# Patient Record
Sex: Male | Born: 1985 | Race: Black or African American | Hispanic: No | Marital: Single | Smoking: Current some day smoker
Health system: Southern US, Community
[De-identification: ages and names within clinical notes are randomized; demographics above are authoritative.]

## PROBLEM LIST (undated history)

## (undated) ENCOUNTER — Emergency Department (HOSPITAL_BASED_OUTPATIENT_CLINIC_OR_DEPARTMENT_OTHER): Admission: EM | Discharge status: Against Medical Advice | Payer: Self-pay

## (undated) DIAGNOSIS — J45909 Unspecified asthma, uncomplicated: Secondary | ICD-10-CM

## (undated) HISTORY — PX: HERNIA REPAIR: SHX51

## (undated) HISTORY — PX: FINGER SURGERY: SHX640

---
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
## 2011-03-04 ENCOUNTER — Emergency Department (INDEPENDENT_AMBULATORY_CARE_PROVIDER_SITE_OTHER): Payer: Self-pay

## 2011-03-04 ENCOUNTER — Emergency Department (HOSPITAL_BASED_OUTPATIENT_CLINIC_OR_DEPARTMENT_OTHER)
Admission: EM | Admit: 2011-03-04 | Discharge: 2011-03-04 | Disposition: A | Discharge status: Home / Self Care | Payer: Self-pay | Attending: Emergency Medicine | Admitting: Emergency Medicine

## 2011-03-04 ENCOUNTER — Encounter: Payer: Self-pay | Admitting: *Deleted

## 2011-03-04 DIAGNOSIS — N451 Epididymitis: Secondary | ICD-10-CM

## 2011-03-04 DIAGNOSIS — N509 Disorder of male genital organs, unspecified: Secondary | ICD-10-CM

## 2011-03-04 DIAGNOSIS — N5089 Other specified disorders of the male genital organs: Secondary | ICD-10-CM

## 2011-03-04 DIAGNOSIS — R109 Unspecified abdominal pain: Secondary | ICD-10-CM | POA: Insufficient documentation

## 2011-03-04 DIAGNOSIS — N453 Epididymo-orchitis: Secondary | ICD-10-CM | POA: Insufficient documentation

## 2011-03-04 DIAGNOSIS — N433 Hydrocele, unspecified: Secondary | ICD-10-CM

## 2011-03-04 LAB — URINALYSIS, ROUTINE W REFLEX MICROSCOPIC
Glucose, UA: NEGATIVE mg/dL
Ketones, ur: NEGATIVE mg/dL
Nitrite: NEGATIVE
Specific Gravity, Urine: 1.029 (ref 1.005–1.030)
pH: 5.5 (ref 5.0–8.0)

## 2011-03-04 LAB — URINE MICROSCOPIC-ADD ON

## 2011-03-04 MED ORDER — OXYCODONE-ACETAMINOPHEN 5-325 MG PO TABS
2.0000 | ORAL_TABLET | Freq: Once | ORAL | Status: AC
  Administered 2011-03-04: 2 via ORAL
  Filled 2011-03-04: qty 2

## 2011-03-04 MED ORDER — LIDOCAINE HCL (PF) 1 % IJ SOLN
INTRAMUSCULAR | Status: AC
  Administered 2011-03-04: 2.1 mL via INTRAMUSCULAR
  Filled 2011-03-04: qty 5

## 2011-03-04 MED ORDER — OXYCODONE-ACETAMINOPHEN 5-325 MG PO TABS: 2.0000 | ORAL_TABLET | ORAL | Status: AC | PRN

## 2011-03-04 MED ORDER — CEFTRIAXONE SODIUM 1 G IJ SOLR
1.0000 g | Freq: Once | INTRAMUSCULAR | Status: AC
  Administered 2011-03-04: 1 g via INTRAMUSCULAR
  Filled 2011-03-04: qty 1

## 2011-03-04 MED ORDER — AZITHROMYCIN 1 G PO PACK: 1.0000 g | PACK | Freq: Once | ORAL | Status: DC

## 2011-03-04 MED ORDER — LIDOCAINE HCL (PF) 1 % IJ SOLN
1.2000 mL | Freq: Once | INTRAMUSCULAR | Status: DC
  Filled 2011-03-04: qty 5

## 2011-03-04 MED ORDER — DOXYCYCLINE HYCLATE 100 MG PO CAPS: 100.0000 mg | ORAL_CAPSULE | Freq: Two times a day (BID) | ORAL | Status: AC

## 2011-03-04 MED ORDER — AZITHROMYCIN 250 MG PO TABS
ORAL_TABLET | ORAL | Status: AC
  Administered 2011-03-04: 1000 mg via ORAL
  Filled 2011-03-04: qty 3

## 2011-03-04 MED ORDER — AZITHROMYCIN 250 MG PO TABS
ORAL_TABLET | ORAL | Status: AC
  Filled 2011-03-04: qty 4

## 2011-03-04 NOTE — ED Notes (Signed)
C/o pain and swelling in left testicle for two days.

## 2011-03-04 NOTE — ED Notes (Signed)
rx x 2 given for doxycycline and percocet- d/c home with ride

## 2011-03-04 NOTE — ED Provider Notes (Signed)
History     CSN: [id number] Arrival date & time: 03/04/2011  5:31 PM  Chief Complaint  Patient presents with  . Groin Pain    (Consider location/radiation/quality/duration/timing/severity/associated sxs/prior treatment) Patient is a 25 y.o. male presenting with testicular pain. The history is provided by the patient. No language interpreter was used.  Testicle Pain This is a new problem. The current episode started in the past 7 days. The problem occurs constantly. The problem has been gradually worsening. Associated symptoms include fatigue and a fever. The symptoms are aggravated by nothing. He has tried nothing for the symptoms. The treatment provided moderate relief.  Pt complains of swelling and pain to left testicle.  Pt reports red and painful now.  Pt has had a fever,   History reviewed. No pertinent past medical history.  History reviewed. No pertinent past surgical history.  No family history on file.  History  Substance Use Topics  . Smoking status: Current Some Day Smoker  . Smokeless tobacco: Not on file  . Alcohol Use: Yes      Review of Systems  Constitutional: Positive for fever and fatigue.  Genitourinary: Positive for testicular pain.  All other systems reviewed and are negative.    Allergies  Review of patient's allergies indicates no known allergies.  Home Medications  No current outpatient prescriptions on file.  BP 136/69  Pulse 81  Temp 99.5 F (37.5 C)  Resp 16  SpO2 96%  Physical Exam  Nursing note and vitals reviewed. Constitutional: He appears well-developed and well-nourished.  HENT:  Head: Normocephalic.  Eyes: Pupils are equal, round, and reactive to light.  Neck: Normal range of motion.  Cardiovascular: Normal rate.   Pulmonary/Chest: Effort normal.  Abdominal: Soft.  Genitourinary: Penis normal.       tntc wbc's  Musculoskeletal: Normal range of motion.  Neurological: He is alert.  Skin: Skin is warm and dry.    Psychiatric: He has a normal mood and affect.    ED Course  Procedures (including critical care time)  Labs Reviewed  URINALYSIS, ROUTINE W REFLEX MICROSCOPIC - Abnormal; Notable for the following:    Appearance TURBID (*)    Hgb urine dipstick MODERATE (*)    Protein, ur 100 (*)    Leukocytes, UA LARGE (*)    All other components within normal limits  URINE MICROSCOPIC-ADD ON - Abnormal; Notable for the following:    Bacteria, UA FEW (*)    All other components within normal limits   US Scrotum  03/04/2011  *RADIOLOGY REPORT*  Clinical Data:  Left testicular pain and swelling for 2 days  SCROTAL ULTRASOUND DOPPLER ULTRASOUND OF THE TESTICLES  Technique: Complete ultrasound examination of the testicles, epididymis, and other scrotal structures was performed.  Color and spectral Doppler ultrasound were also utilized to evaluate blood flow to the testicles.  Comparison:  None  Findings:  Right testis:  18 x 27 x 43 mm, homogeneous in echotexture without focal lesion.  Normal color Doppler signal.  Arterial and venous waveforms returned.  Left testis:  21 x 32 x 38 mm, normal in echotexture without focal lesion.  Normal color Doppler signal.  Arterial and venous waveforms returned.  Right epididymis:  Normal in size and appearance.  Left epididymis:  Normal in size and appearance.  Hydocele:  Small, bilateral  Varicocele:  None seen  Pulsed Doppler interrogation of both testes demonstrates low resistance flow bilaterally.  IMPRESSION:  1.  Normal testes.  No mass. 2.  Small  bilateral hydroceles.  Original Report Authenticated By: Osa Craver, M.D.   [place] Art/ven Flow Abd Pelv Doppler  03/04/2011  *RADIOLOGY REPORT*  Clinical Data:  Left testicular pain and swelling for 2 days  SCROTAL ULTRASOUND DOPPLER ULTRASOUND OF THE TESTICLES  Technique: Complete ultrasound examination of the testicles, epididymis, and other scrotal structures was performed.  Color and spectral Doppler ultrasound  were also utilized to evaluate blood flow to the testicles.  Comparison:  None  Findings:  Right testis:  18 x 27 x 43 mm, homogeneous in echotexture without focal lesion.  Normal color Doppler signal.  Arterial and venous waveforms returned.  Left testis:  21 x 32 x 38 mm, normal in echotexture without focal lesion.  Normal color Doppler signal.  Arterial and venous waveforms returned.  Right epididymis:  Normal in size and appearance.  Left epididymis:  Normal in size and appearance.  Hydocele:  Small, bilateral  Varicocele:  None seen  Pulsed Doppler interrogation of both testes demonstrates low resistance flow bilaterally.  IMPRESSION:  1.  Normal testes.  No mass. 2.  Small bilateral hydroceles.  Original Report Authenticated By: Osa Craver, M.D.     No diagnosis found.    MDM  Ultrasound  Good blood flow.   Urine shows wbc's tntc.   Pt given Rocephin and zithromax po.  Pt given rx for doxycycline and percocet for pain.      Langston Masker, [place] 03/04/11 2008  Langston Masker, [place] 03/04/11 2009

## 2011-03-05 NOTE — ED Provider Notes (Signed)
Medical screening examination/treatment/procedure(s) were performed by non-physician practitioner and as supervising physician I was immediately available for consultation/collaboration.  Tennyson Kallen, MD 03/05/11 0316 

## 2012-06-27 ENCOUNTER — Emergency Department (HOSPITAL_BASED_OUTPATIENT_CLINIC_OR_DEPARTMENT_OTHER)
Admission: EM | Admit: 2012-06-27 | Discharge: 2012-06-27 | Disposition: A | Discharge status: Home / Self Care | Payer: Self-pay | Attending: Emergency Medicine | Admitting: Emergency Medicine

## 2012-06-27 ENCOUNTER — Encounter (HOSPITAL_BASED_OUTPATIENT_CLINIC_OR_DEPARTMENT_OTHER): Payer: Self-pay | Admitting: *Deleted

## 2012-06-27 ENCOUNTER — Emergency Department (HOSPITAL_BASED_OUTPATIENT_CLINIC_OR_DEPARTMENT_OTHER): Payer: Self-pay

## 2012-06-27 DIAGNOSIS — J45909 Unspecified asthma, uncomplicated: Secondary | ICD-10-CM | POA: Insufficient documentation

## 2012-06-27 DIAGNOSIS — R630 Anorexia: Secondary | ICD-10-CM | POA: Insufficient documentation

## 2012-06-27 DIAGNOSIS — Z9889 Other specified postprocedural states: Secondary | ICD-10-CM | POA: Insufficient documentation

## 2012-06-27 DIAGNOSIS — I880 Nonspecific mesenteric lymphadenitis: Secondary | ICD-10-CM | POA: Insufficient documentation

## 2012-06-27 DIAGNOSIS — F17200 Nicotine dependence, unspecified, uncomplicated: Secondary | ICD-10-CM | POA: Insufficient documentation

## 2012-06-27 DIAGNOSIS — R110 Nausea: Secondary | ICD-10-CM | POA: Insufficient documentation

## 2012-06-27 HISTORY — DX: Unspecified asthma, uncomplicated: J45.909

## 2012-06-27 LAB — URINALYSIS, ROUTINE W REFLEX MICROSCOPIC
Bilirubin Urine: NEGATIVE
Hgb urine dipstick: NEGATIVE
Ketones, ur: NEGATIVE mg/dL
Nitrite: NEGATIVE
Protein, ur: NEGATIVE mg/dL
Specific Gravity, Urine: 1.031 — ABNORMAL HIGH (ref 1.005–1.030)
Urobilinogen, UA: 0.2 mg/dL (ref 0.0–1.0)

## 2012-06-27 LAB — COMPREHENSIVE METABOLIC PANEL
ALT: 31 U/L (ref 0–53)
Albumin: 4.4 g/dL (ref 3.5–5.2)
Calcium: 9.5 mg/dL (ref 8.4–10.5)
GFR calc Af Amer: 73 mL/min — ABNORMAL LOW (ref >90)
Glucose, Bld: 95 mg/dL (ref 70–99)
Potassium: 4.1 mEq/L (ref 3.5–5.1)
Sodium: 142 mEq/L (ref 135–145)
Total Protein: 8 g/dL (ref 6.0–8.3)

## 2012-06-27 LAB — CBC WITH DIFFERENTIAL/PLATELET
Basophils Relative: 0 % (ref 0–1)
Eosinophils Absolute: 0.2 10*3/uL (ref 0.0–0.7)
Eosinophils Relative: 3 % (ref 0–5)
Lymphs Abs: 2.5 10*3/uL (ref 0.7–4.0)
MCH: 28.8 pg (ref 26.0–34.0)
MCHC: 34 g/dL (ref 30.0–36.0)
MCV: 84.8 fL (ref 78.0–100.0)
Neutrophils Relative %: 57 % (ref 43–77)
Platelets: 196 10*3/uL (ref 150–400)
RDW: 13.6 % (ref 11.5–15.5)

## 2012-06-27 LAB — LIPASE, BLOOD: Lipase: 21 U/L (ref 11–59)

## 2012-06-27 MED ORDER — SODIUM CHLORIDE 0.9 % IV SOLN
Freq: Once | INTRAVENOUS | Status: AC
  Administered 2012-06-27: 21:00:00 via INTRAVENOUS

## 2012-06-27 MED ORDER — HYDROCODONE-ACETAMINOPHEN 5-325 MG PO TABS: 2.0000 | ORAL_TABLET | ORAL | Status: DC | PRN

## 2012-06-27 MED ORDER — ONDANSETRON 4 MG PO TBDP: 4.0000 mg | ORAL_TABLET | Freq: Three times a day (TID) | ORAL | Status: DC | PRN

## 2012-06-27 MED ORDER — IOHEXOL 300 MG/ML  SOLN
50.0000 mL | Freq: Once | INTRAMUSCULAR | Status: AC | PRN
  Administered 2012-06-27: 50 mL via ORAL

## 2012-06-27 MED ORDER — SODIUM CHLORIDE 0.9 % IV SOLN
Freq: Once | INTRAVENOUS | Status: AC
  Administered 2012-06-27: 18:00:00 via INTRAVENOUS

## 2012-06-27 MED ORDER — IOHEXOL 300 MG/ML  SOLN: 100.0000 mL | Freq: Once | INTRAMUSCULAR | Status: DC | PRN

## 2012-06-27 NOTE — ED Notes (Signed)
Patient back from CT. CT technician notified this RN that patient refused CT contrast d/t complaints of IV. Technician offered to start new IV access. Pt deferred, and was scanned w/o contrast.

## 2012-06-27 NOTE — ED Notes (Signed)
Able to obtain labs using butterfly to right hand x 1 attempt. Pt tolerated well.

## 2012-06-27 NOTE — ED Provider Notes (Signed)
History     CSN: [id number]  Arrival date & time 06/27/12  1620   First MD Initiated Contact with Patient 06/27/12 1713      Chief Complaint  Patient presents with  . Abdominal Pain    (Consider location/radiation/quality/duration/timing/severity/associated sxs/prior treatment) Patient is a 27 y.o. male presenting with abdominal pain. The history is provided by the patient.  Abdominal Pain The primary symptoms of the illness include abdominal pain and nausea. The current episode started 2 days ago. The onset of the illness was gradual. The problem has been gradually worsening.  Associated with: nothing. The patient has not had a change in bowel habit. Risk factors for an acute abdominal problem include a history of abdominal surgery. Additional symptoms associated with the illness include anorexia. Significant associated medical issues do not include PUD.  Pt reports pain in mid abdomen  History reviewed. No pertinent past medical history.  Past Surgical History  Procedure Date  . 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  History  Substance Use Topics  . Smoking status: Current Some Day Smoker  . Smokeless tobacco: Not on file  . Alcohol Use: No      Review of Systems  Gastrointestinal: Positive for nausea, abdominal pain and anorexia.  All other systems reviewed and are negative.    Allergies  Review of patient's allergies indicates no known allergies.  Home Medications   Current Outpatient Rx  Name  Route  Sig  Dispense  Refill  . MULTIVITAMINS PO CAPS   Oral   Take 1 capsule by mouth daily.           BP 128/73  Pulse 60  Temp 98.8 F (37.1 C) (Oral)  Resp 18  Ht 6\' 3"  (1.905 m)  Wt 225 lb (102.059 kg)  BMI 28.12 kg/m2  SpO2 100%  Physical Exam  Nursing note and vitals reviewed. Constitutional: He appears well-developed and well-nourished.  HENT:  Head: Normocephalic.  Right Ear: External ear  normal.  Left Ear: External ear normal.  Nose: Nose normal.  Mouth/Throat: Oropharynx is clear and moist.  Eyes: Conjunctivae normal are normal. Pupils are equal, round, and reactive to light.  Neck: Normal range of motion. Neck supple.  Cardiovascular: Normal rate and regular rhythm.   Pulmonary/Chest: Effort normal and breath sounds normal.  Abdominal: Soft. There is tenderness. There is guarding.  Musculoskeletal: Normal range of motion.  Neurological: He is alert.  Skin: Skin is warm.    ED Course  Procedures (including critical care time)  Labs Reviewed  URINALYSIS, ROUTINE W REFLEX MICROSCOPIC - Abnormal; Notable for the following:    Specific Gravity, Urine 1.031 (*)     All other components within normal limits   No results found.   No diagnosis found.    MDM   Results for orders placed during the hospital encounter of 06/27/12  URINALYSIS, ROUTINE W REFLEX MICROSCOPIC      Component Value Range   Color, Urine YELLOW  YELLOW   APPearance CLEAR  CLEAR   Specific Gravity, Urine 1.031 (*) 1.005 - 1.030   pH 5.5  5.0 - 8.0   Glucose, UA NEGATIVE  NEGATIVE mg/dL   Hgb urine dipstick NEGATIVE  NEGATIVE   Bilirubin Urine NEGATIVE  NEGATIVE   Ketones, ur NEGATIVE  NEGATIVE mg/dL   Protein, ur NEGATIVE  NEGATIVE mg/dL   Urobilinogen, UA 0.2  0.0 - 1.0 mg/dL   Nitrite  NEGATIVE  NEGATIVE   Leukocytes, UA NEGATIVE  NEGATIVE  CBC WITH DIFFERENTIAL      Component Value Range   WBC 7.6  4.0 - 10.5 K/uL   RBC 5.34  4.22 - 5.81 MIL/uL   Hemoglobin 15.4  13.0 - 17.0 g/dL   HCT 16.1  09.6 - 04.5 %   MCV 84.8  78.0 - 100.0 fL   MCH 28.8  26.0 - 34.0 pg   MCHC 34.0  30.0 - 36.0 g/dL   RDW 40.9  81.1 - 91.4 %   Platelets 196  150 - 400 K/uL   Neutrophils Relative 57  43 - 77 %   Neutro Abs 4.3  1.7 - 7.7 K/uL   Lymphocytes Relative 32  12 - 46 %   Lymphs Abs 2.5  0.7 - 4.0 K/uL   Monocytes Relative 8  3 - 12 %   Monocytes Absolute 0.6  0.1 - 1.0 K/uL   Eosinophils  Relative 3  0 - 5 %   Eosinophils Absolute 0.2  0.0 - 0.7 K/uL   Basophils Relative 0  0 - 1 %   Basophils Absolute 0.0  0.0 - 0.1 K/uL  COMPREHENSIVE METABOLIC PANEL      Component Value Range   Sodium 142  135 - 145 mEq/L   Potassium 4.1  3.5 - 5.1 mEq/L   Chloride 104  96 - 112 mEq/L   CO2 26  19 - 32 mEq/L   Glucose, Bld 95  70 - 99 mg/dL   BUN 17  6 - 23 mg/dL   Creatinine, Ser 7.82 (*) 0.50 - 1.35 mg/dL   Calcium 9.5  8.4 - 95.6 mg/dL   Total Protein 8.0  6.0 - 8.3 g/dL   Albumin 4.4  3.5 - 5.2 g/dL   AST 28  0 - 37 U/L   ALT 31  0 - 53 U/L   Alkaline Phosphatase 89  39 - 117 U/L   Total Bilirubin 0.2 (*) 0.3 - 1.2 mg/dL   GFR calc non Af Amer 63 (*) >90 mL/min   GFR calc Af Amer 73 (*) >90 mL/min  LIPASE, BLOOD      Component Value Range   Lipase 21  11 - 59 U/L   Ct Abdomen Pelvis Wo Contrast  06/27/2012  *RADIOLOGY REPORT*  Clinical Data: Cramping lower abdominal pain.  CT ABDOMEN AND PELVIS WITHOUT CONTRAST  Technique:  Multidetector CT imaging of the abdomen and pelvis was performed following the standard protocol without intravenous contrast.  Comparison: No comparison CT.  Findings: The appendix does not appear inflamed.  No extraluminal bowel inflammatory process, free fluid or free air is noted.  There are however, increased number of lymph nodes in the right lower quadrant largest measuring up to 1.6 cm.  This may reflect mesenteric adenitis.  Evaluation of solid abdominal viscera is limited by lack of IV contrast.  Taking this limitation into account no worrisome focal hepatic, splenic, pancreatic, renal or adrenal lesion.  No abdominal aortic aneurysm.  No bony destructive lesion.  Lung bases are clear.  IMPRESSION: The appendix does not appear inflamed.  No extraluminal bowel inflammatory process, free fluid or free air is noted.  There are however, increased number of lymph nodes in the right lower quadrant largest measuring up to 1.6 cm.  This may reflect mesenteric  adenitis.   Original Report Authenticated By: Lacy Duverney, M.D.        Pt given Iv fluids x 2  liters,    Pt advised of ct findings.  Pt given rx for hydrocodone and zofran.   Pt advised to return if symptoms worsen or change   Lonia Skinner [place], [place] 06/27/12 2119

## 2012-06-27 NOTE — ED Provider Notes (Signed)
Medical screening examination/treatment/procedure(s) were performed by non-physician practitioner and as supervising physician I was immediately available for consultation/collaboration.   Celene Kras, MD 06/27/12 2130

## 2012-06-27 NOTE — ED Notes (Signed)
Patient states he developed crampy lower abdominal pain for the last 36 hours.  States the pain in intermittent and shooting from his umbilicus toward his lower abdomen.  After the crampy abd pain, he has upper abdominal pain that feels like indigestion.  Denies nausea, vomiting or diarrhea.

## 2012-06-27 NOTE — ED Notes (Signed)
Unable to obtain labs with iv attempts. Will give liter of fluids for hydration and attempt labs after liter infuses. Langston Masker, pa is aware.

## 2012-06-27 NOTE — ED Notes (Signed)
Patient finished oral contrast, tolerated well.

## 2013-04-07 ENCOUNTER — Encounter (HOSPITAL_BASED_OUTPATIENT_CLINIC_OR_DEPARTMENT_OTHER): Payer: Self-pay | Admitting: Emergency Medicine

## 2013-04-07 ENCOUNTER — Emergency Department (HOSPITAL_BASED_OUTPATIENT_CLINIC_OR_DEPARTMENT_OTHER)
Admission: EM | Admit: 2013-04-07 | Discharge: 2013-04-07 | Disposition: A | Discharge status: Home / Self Care | Payer: Self-pay | Attending: Emergency Medicine | Admitting: Emergency Medicine

## 2013-04-07 ENCOUNTER — Emergency Department (HOSPITAL_BASED_OUTPATIENT_CLINIC_OR_DEPARTMENT_OTHER): Payer: Self-pay

## 2013-04-07 DIAGNOSIS — R0789 Other chest pain: Secondary | ICD-10-CM

## 2013-04-07 DIAGNOSIS — F17200 Nicotine dependence, unspecified, uncomplicated: Secondary | ICD-10-CM | POA: Insufficient documentation

## 2013-04-07 DIAGNOSIS — R071 Chest pain on breathing: Secondary | ICD-10-CM | POA: Insufficient documentation

## 2013-04-07 DIAGNOSIS — J45909 Unspecified asthma, uncomplicated: Secondary | ICD-10-CM | POA: Insufficient documentation

## 2013-04-07 DIAGNOSIS — R05 Cough: Secondary | ICD-10-CM

## 2013-04-07 MED ORDER — ALBUTEROL SULFATE HFA 108 (90 BASE) MCG/ACT IN AERS: 1.0000 | INHALATION_SPRAY | Freq: Four times a day (QID) | RESPIRATORY_TRACT | Status: AC | PRN

## 2013-04-07 MED ORDER — HYDROCODONE-ACETAMINOPHEN 5-325 MG PO TABS: 1.0000 | ORAL_TABLET | ORAL | Status: DC | PRN

## 2013-04-07 MED ORDER — GI COCKTAIL ~~LOC~~
30.0000 mL | Freq: Once | ORAL | Status: AC
  Administered 2013-04-07: 30 mL via ORAL
  Filled 2013-04-07: qty 30

## 2013-04-07 NOTE — ED Provider Notes (Signed)
Medical screening examination/treatment/procedure(s) were performed by non-physician practitioner and as supervising physician I was immediately available for consultation/collaboration.     Date: 04/07/2013  Rate: 66  Rhythm: normal sinus rhythm  QRS Axis: right  Intervals: normal  ST/T Wave abnormalities: normal  Conduction Disutrbances:none  Narrative Interpretation:   Old EKG Reviewed: none available    Shanna Cisco, MD 04/07/13 2000

## 2013-04-07 NOTE — ED Provider Notes (Signed)
CSN: [id number]     Arrival date & time 04/07/13  1304 History   First MD Initiated Contact with Patient 04/07/13 1413     Chief Complaint  Patient presents with  . Chest Pain   (Consider location/radiation/quality/duration/timing/severity/associated sxs/prior Treatment) Patient is a 27 y.o. male presenting with chest pain. The history is provided by the patient. No language interpreter was used.  Chest Pain Pain location:  L chest Associated symptoms: cough   Associated symptoms: no abdominal pain, no fever, no nausea, no shortness of breath and no weakness   Associated symptoms comment:  Chest pain associated with cough for the past several days. Initially, pain was located in right chest but now in the left. No SOB or painful breathing. Pain is worst with cough or certain movements. No N, V. No fever.   Past Medical History  Diagnosis Date  . Asthma    Past Surgical History  Procedure Laterality Date  . Abdominal mass resection  2005    non cancerous twin mass   No family history on file. History  Substance Use Topics  . Smoking status: Current Some Day Smoker  . Smokeless tobacco: Not on file  . Alcohol Use: No    Review of Systems  Constitutional: Negative for fever and chills.  HENT: Negative.  Negative for congestion.   Respiratory: Positive for cough. Negative for shortness of breath.   Cardiovascular: Positive for chest pain.  Gastrointestinal: Negative.  Negative for nausea and abdominal pain.  Musculoskeletal: Negative.  Negative for myalgias.  Skin: Negative.   Neurological: Negative.  Negative for weakness.    Allergies  Review of patient's allergies indicates no known allergies.  Home Medications  No current outpatient prescriptions on file. BP 117/62  Pulse 52  Temp(Src) 99.5 F (37.5 C) (Oral)  Resp 18  SpO2 100% Physical Exam  Constitutional: He is oriented to person, place, and time. He appears well-developed and well-nourished.  HENT:   Head: Normocephalic.  Neck: Normal range of motion. Neck supple.  Cardiovascular: Normal rate and regular rhythm.   Pulmonary/Chest: Effort normal and breath sounds normal. He has no wheezes. He has no rales. He exhibits tenderness.  Abdominal: Soft. Bowel sounds are normal. There is no tenderness. There is no rebound and no guarding.  Musculoskeletal: Normal range of motion. He exhibits no edema.  Neurological: He is alert and oriented to person, place, and time.  Skin: Skin is warm and dry. No rash noted.  Psychiatric: He has a normal mood and affect.    ED Course  Procedures (including critical care time) Labs Review Labs Reviewed - No data to display Imaging Review Dg Chest 2 View  04/07/2013   CLINICAL DATA:  Left-sided upper chest pain this afternoon, also with upper back pain, history of asthma and smoking  EXAM: CHEST  2 VIEW  COMPARISON:  None.  FINDINGS: Borderline enlarged cardiac silhouette. Normal mediastinal contours. No focal airspace opacities. No pleural effusion or pneumothorax. No evidence of edema. No acute osseus abnormalities.  IMPRESSION: Borderline enlarged cardiac silhouette without acute cardiopulmonary disease.   Electronically Signed   By: Simonne Come M.D.   On: 04/07/2013 15:38    EKG Interpretation   None       MDM  No diagnosis found. 1. Chest wall pain 2. Cough  No better with GI cocktail. CXR without pna or other abnormality. Chest is reproducibly tender to palpation, c/w muscular chest wall pain. VSS, normal 100% O2 sat.  Arnoldo Hooker, PA-C 04/07/13 [phone]

## 2014-01-07 ENCOUNTER — Emergency Department (HOSPITAL_BASED_OUTPATIENT_CLINIC_OR_DEPARTMENT_OTHER)
Admission: EM | Admit: 2014-01-07 | Discharge: 2014-01-07 | Disposition: A | Discharge status: Home / Self Care | Payer: Self-pay | Attending: Emergency Medicine | Admitting: Emergency Medicine

## 2014-01-07 ENCOUNTER — Emergency Department (HOSPITAL_BASED_OUTPATIENT_CLINIC_OR_DEPARTMENT_OTHER): Payer: Self-pay

## 2014-01-07 ENCOUNTER — Encounter (HOSPITAL_BASED_OUTPATIENT_CLINIC_OR_DEPARTMENT_OTHER): Payer: Self-pay | Admitting: Emergency Medicine

## 2014-01-07 DIAGNOSIS — Z9889 Other specified postprocedural states: Secondary | ICD-10-CM | POA: Insufficient documentation

## 2014-01-07 DIAGNOSIS — J45909 Unspecified asthma, uncomplicated: Secondary | ICD-10-CM | POA: Insufficient documentation

## 2014-01-07 DIAGNOSIS — R109 Unspecified abdominal pain: Secondary | ICD-10-CM | POA: Insufficient documentation

## 2014-01-07 DIAGNOSIS — F17200 Nicotine dependence, unspecified, uncomplicated: Secondary | ICD-10-CM | POA: Insufficient documentation

## 2014-01-07 DIAGNOSIS — N509 Disorder of male genital organs, unspecified: Secondary | ICD-10-CM | POA: Insufficient documentation

## 2014-01-07 DIAGNOSIS — IMO0002 Reserved for concepts with insufficient information to code with codable children: Secondary | ICD-10-CM

## 2014-01-07 DIAGNOSIS — R6883 Chills (without fever): Secondary | ICD-10-CM | POA: Insufficient documentation

## 2014-01-07 DIAGNOSIS — Z79899 Other long term (current) drug therapy: Secondary | ICD-10-CM | POA: Insufficient documentation

## 2014-01-07 LAB — URINALYSIS, ROUTINE W REFLEX MICROSCOPIC
BILIRUBIN URINE: NEGATIVE
GLUCOSE, UA: NEGATIVE mg/dL
Hgb urine dipstick: NEGATIVE
KETONES UR: NEGATIVE mg/dL
LEUKOCYTES UA: NEGATIVE
Nitrite: NEGATIVE
PROTEIN: NEGATIVE mg/dL
Specific Gravity, Urine: 1.034 — ABNORMAL HIGH (ref 1.005–1.030)
Urobilinogen, UA: 1 mg/dL (ref 0.0–1.0)
pH: 6 (ref 5.0–8.0)

## 2014-01-07 NOTE — ED Notes (Signed)
Pain scale reentred for computer to allow me to discharge pt

## 2014-01-07 NOTE — ED Notes (Signed)
Pt c/o scrotum pain x 2 months , denies penile drainage

## 2014-01-07 NOTE — Discharge Instructions (Signed)
Take Tylenol or Advil for pain. Your ultrasound study and urine test today werenormal Call any of the numbers on the resource guide to get a primary care physician.  Emergency Department Resource Guide 1) Find a Doctor and Pay Out of Pocket Although you won't have to find out who is covered by your insurance plan, it is a good idea to ask around and get recommendations. You will then need to call the office and see if the doctor you have chosen will accept you as a new patient and what types of options they offer for patients who are self-pay. Some doctors offer discounts or will set up payment plans for their patients who do not have insurance, but you will need to ask so you aren't surprised when you get to your appointment.  2) Contact Your Local Health Department Not all health departments have doctors that can see patients for sick visits, but many do, so it is worth a call to see if yours does. If you don't know where your local health department is, you can check in your phone book. The CDC also has a tool to help you locate your state's health department, and many state websites also have listings of all of their local health departments.  3) Find a Walk-in Clinic If your illness is not likely to be very severe or complicated, you may want to try a walk in clinic. These are popping up all over the country in pharmacies, drugstores, and shopping centers. They're usually staffed by nurse practitioners or physician assistants that have been trained to treat common illnesses and complaints. They're usually fairly quick and inexpensive. However, if you have serious medical issues or chronic medical problems, these are probably not your best option.  No Primary Care Doctor: - Call Health Connect at  [phone] - they can help you locate a primary care doctor that  accepts your insurance, provides certain services, etc. - Physician Referral Service- [phone]  Chronic Pain  Problems: Organization         Address  Phone   Notes  [facility]  [phone] Patients need to be referred by their primary care doctor.   Medication Assistance: Organization         Address  Phone   Notes  [facility] [address] [place], [place] [address] [phone]) [phone] --Must be a resident of [facility] [place] -- Must have NO insurance coverage whatsoever (no Medicaid/ Medicare, etc.) -- The pt. MUST have a primary care doctor that directs their care regularly and follows them in the community   MedAssist  [phone]   [employer]  [phone]    Agencies that provide inexpensive medical care: Organization         Address  Phone   Notes  [facility]  [phone]   [facility]    [phone]   [facility] [address] [place], [place] [address] [phone]   [facility] [address], [place] [phone]) [phone]   [facility]    [phone]) [phone]   [facility]    [phone]) [phone]   [facility]  [address], [place] Phone:  [phone], Fax:  [phone] Hours of Operation:  9 am - 6 pm, M-F.  Also accepts Medicaid/Medicare and self-pay.  [facility]  [address]  [address], [place] Phone: [phone], Fax: [phone]. Hours of Operation:  8:30 am - 5:30 pm, M-F.  Also accepts Medicaid and self-pay.  [facility] [place] [address] [place] Phone: [phone]   [place], [place], [place] [phone], Ext. [phone] Mondays & Thursdays: 7-9 AM.  First 15 patients are seen on a first come, first serve basis.    [place] Providers:  Organization         Address  Phone   Notes  [facility] [address], [place] [phone] Also  accepts self-pay patients.  [facility] [address], [place]  [phone]   [place] [address], [place] [phone]   [facility] [address] [place] [phone]   Lucianne Lei [address], [place]   [phone] Only accepts [place] Access [place] patients after they have their name applied to their card.   Self-Pay (no insurance) in [facility]:  Organization         Address  Phone   Notes  Sickle Cell Patients, [facility] [place] [phone]   [facility] [place] [phone]   [facility] [place]  [place], [place], [place] [phone]   [facility]/Dr. Osei-Bonsu  [address] [place] [address], [place] or [address], [place] [phone] Phone number for both [place] and [place] locations is the same.  [facility] [address] [place] [phone]   [facility] [address], [place] or [address] [phone]   [facility] [address], [place] [phone], phone; [phone], fax Sees patients 1st and 3rd Saturday of every month.  Must not qualify for public or private insurance (i.e. Medicaid, Medicare, [place] Health Choice, Veterans' Benefits)  Household income should be no more than 200% of the poverty level The clinic cannot treat you if you are pregnant or think you are pregnant  Sexually transmitted diseases are not treated at the clinic.    Dental Care: Organization         Address  Phone  Notes  [facility] [phone] Accepts children up to age 14 who are enrolled in [place] or [place]; pregnant  women with a Medicaid card; and children who have applied for Medicaid or [place] Health Choice, but were declined, whose parents can pay a reduced fee at time of service.  [facility]  [address], [place] [phone] Accepts children up to age 57 who are enrolled in [place] or [place]; pregnant women with a Medicaid card; and children who have applied for Medicaid or [place] Health Choice, but were declined, whose parents can pay a reduced fee at time of service.  [facility]  [place] [phone] Patients are seen by appointment only. Walk-ins are not accepted. [place] will see patients 65 years of age and older. Monday - Tuesday (8am-5pm) Most Wednesdays (8:30-5pm) $30 per visit, cash only  [facility] [facility]  [address], [place] [phone] Patients are seen by appointment only. Walk-ins are not accepted. [facility] will see patients 18 years of age and older. °One Wednesday Evening (Monthly: Volunteer Based).  $30 per visit, cash only  °[facility]  [phone] for adults; Children under age 4, call Graduate Pediatric Dentistry at [phone]. Children aged 4-14, please call [phone] to request a pediatric application. ° Dental services are provided in all areas of dental care including fillings, crowns and bridges, complete and partial dentures, implants, gum treatment, root canals, and extractions. Preventive care is also provided. Treatment is provided to both adults and children. °Patients are selected via a lottery and there is often a waiting list. °  °[facility] [address], °[place] ° ([phone] [email or web address] °  °[facility] [address], [place], [place] [phone], Ext. [phone] Second and Fourth Thursday of each month, opens at 6:30 AM; Clinic ends at 9 AM.  Patients are  seen on a first-come first-served basis, and a limited number are seen during each clinic.  ° °[facility] ° [address], [place], [place] [phone]   Eligibility Requirements °You must have lived in [place], [place], or [place] counties for at least the last three months. °  You cannot be eligible for state or federal sponsored healthcare insurance, including Veterans Administration, Medicaid, or Medicare. °  You generally cannot be eligible for healthcare insurance through your employer.  °  How to apply: °Eligibility screenings are held every Tuesday and Wednesday afternoon from 1:00 pm until 4:00 pm. You do not need an appointment for the interview!  °[facility] [address], [place], [place] [phone]   °[facility]  [phone]   °[facility]  [phone]   °[facility]  336-570-6415   ° °Behavioral Health Resources in the Community: °Intensive Outpatient Programs °Organization         Address  Phone  Notes  °[facility] [address], [place], [place] [phone]   °[facility] [address], [place], [place] [phone]   °[facility] [address], [place], [place] ° 336-882-2125   °[facility] [address],  °[place], [place] 1-800-853-5163 or 336-641-4981   °Substance Abuse Resources °Organization         Address  Phone  Notes  °[facility]  336-882-2125   °[facility]  336-784-9470   °[facility]  336-285-9073   °[facility]  336-845-3988   °Residential & Outpatient Substance Abuse Program  1-800-659-3381   °Psychological Services °Organization         Address  Phone  Notes  ° Health  336- [phone]   °[facility]  336- 378-7881   °[facility] [address], [place] 1-800-853-5163 or 336-641-4981   ° °Mobile Crisis  Teams °Organization         Address  Phone  Notes  °[facility], Mobile Crisis Care Unit  1-877-626-1772   °Assertive °[facility] Services ° [address] [place], [place] 336-834-9664   °Sharon DeEsch [address] °[place] 336-554-5454   ° °Self-Help/Support Groups °Organization         Address  Phone             Notes  °[facility] [place] -   variety of support groups  336- 373-1402 Call for more information  °Narcotics Anonymous (NA), [facility] [address], °[place]  2 meetings at this location  ° °Residential Treatment Programs °Organization         Address  Phone  Notes  °[facility] [address],    °[place]  1-866-801-8205   °[facility] ° [address], [place], [place] [phone]   °[facility] [address], [place] 336-845-3988 Admissions: 8am-3pm M-F  °[facility] [address]    °[place], [place] 336-841-1104   °[facility] [address], [place], [place] 336-379-7146   °[facility] [address]  °[place], [place] 336-285-9073   °[facility] - Intensive Outpatient [address], [place], [place] 336-852-3033   °[facility] ([facility] [address]  °[place], [place] 1-877-615-2722 or 336-784-9470   °[facility] ([facility]) [address] [place], [place] 336-227-7417 Accepts Medicaid  °[facility] [address]  °[place] 1-800-659-3381 Substance Abuse/Addiction Treatment  ° °[place] [facility] Health Resources °Organization         Address  Phone  Notes  °[facility]  (888) 581-9988   °Julie Brannon, PhD [address] [place], [place]   (336) 349-5553 or (336) 951-0000   °[facility]   [address] °[place], [place] (336) 349-4454   °[facility] [address], [place], [place] (336) 342-8316  Insurance/Medicaid/sponsorship through [facility]  °[facility] [address]                                    [place], [place] (336) [phone] Therapy/tele-psych/case  °[facility] [address]  ° [place], [place] (336) [phone]    °Dr. Arfeen  (336) 349-4544   °[facility] [place]  [employer] [place] [facility] 1) [address],  °2) [address], [place] °3)  [address], [place] (336) 349-3220 °(336) 342-7768 ° °(336) 342-8140   °[place] Child Abuse Hotline (336) 342-1394 or (336) 342-3537 (After Hours)    ° ° °

## 2014-01-07 NOTE — ED Provider Notes (Signed)
CSN: [id number]     Arrival date & time 01/07/14  1531 History  This chart was scribed for Doug Sou, MD by Charline Bills, ED Scribe. The patient was seen in room MH07/MH07. Patient's care was started at 3:38 PM.   Chief Complaint  Patient presents with  . Testicle Pain   Patient is a 28 y.o. male presenting with testicular pain. The history is provided by the patient. No language interpreter was used.  Testicle Pain This is a recurrent problem. The current episode started more than 2 days ago. The problem has been gradually worsening. Associated symptoms include abdominal pain. The symptoms are aggravated by exertion. Nothing relieves the symptoms.   HPI Comments: Dale Gallagher is a 28 y.o. male who presents to the Emergency Department complaining of L sided scrotum pain. Pt reports similar pain last year. Pt was diagnosed with epididymitis and completed medication but states that pain never fully resolved. Pt states that pain has worsened over the past week due to working more. Pain is worse when he squats. He reports associated suprapubic pain and chills, last episode approximately 3 hours ago. He denies dysuria or penile discharge. Last normal BM was today. Pt consumed 2 sandwiches and a bag of chips today. He has taken Tylenol for pain. Pt is a smoker and occasional drinker. No illicit drug use. presently discomfort is minimal   Past Medical History  Diagnosis Date  . Asthma    Past Surgical History  Procedure Laterality Date  . 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 No pertinent family history. History  Substance Use Topics  . Smoking status: Current Some Day Smoker -- 0.50 packs/day    Types: Cigarettes  . Smokeless tobacco: Not on file  . Alcohol Use: No    Review of Systems  Constitutional: Positive for chills. Negative for appetite change. Fever: unknown.  HENT: Negative.   Respiratory: Negative.   Cardiovascular: Negative.    Gastrointestinal: Positive for abdominal pain.  Genitourinary: Positive for testicular pain. Negative for dysuria and discharge.  Musculoskeletal: Negative.   Skin: Negative.   Neurological: Negative.   Psychiatric/Behavioral: Negative.   All other systems reviewed and are negative.  Allergies  Review of patient's allergies indicates no known allergies.  Home Medications   Prior to Admission medications   Medication Sig Start Date End Date Taking? Authorizing Provider  albuterol (PROVENTIL HFA;VENTOLIN HFA) 108 (90 BASE) MCG/ACT inhaler Inhale 1-2 puffs into the lungs every 6 (six) hours as needed for wheezing or shortness of breath. 04/07/13   Arnoldo Hooker, PA-C   Triage Vitals: BP 142/73  Pulse 66  Temp(Src) 98.1 F (36.7 C) (Oral)  Resp 18  Ht 6\' 2"  (1.88 m)  Wt 220 lb (99.791 kg)  BMI 28.23 kg/m2  SpO2 100% Physical Exam  Nursing note and vitals reviewed. Constitutional: He appears well-developed and well-nourished.  HENT:  Head: Normocephalic and atraumatic.  Eyes: Conjunctivae are normal. Pupils are equal, round, and reactive to light.  Neck: Neck supple. No tracheal deviation present. No thyromegaly present.  Cardiovascular: Normal rate and regular rhythm.   No murmur heard. Pulmonary/Chest: Effort normal and breath sounds normal.  Abdominal: Soft. Bowel sounds are normal. He exhibits no distension. There is no tenderness.  Midline surgical scar  Genitourinary: Penis normal.  scrotum testes in normal lie, nontender. No scrotal masses, no hernia  Musculoskeletal: Normal range of motion. He exhibits no edema and no tenderness.  Neurological:  He is alert. Coordination normal.  Skin: Skin is warm and dry. No rash noted.  Psychiatric: He has a normal mood and affect.   ED Course  Procedures (including critical care time) DIAGNOSTIC STUDIES: Oxygen Saturation is 100% on RA, normal by my interpretation.    COORDINATION OF CARE: 3:44 PM-Discussed treatment plan  which includes UA with pt at bedside and pt agreed to plan.   Labs Review Labs Reviewed  URINALYSIS, ROUTINE W REFLEX MICROSCOPIC - Abnormal; Notable for the following:    Specific Gravity, Urine 1.034 (*)    All other components within normal limits   Imaging Review [place] Scrotum  01/07/2014   CLINICAL DATA:  Left scrotal pain  EXAM: SCROTAL ULTRASOUND  DOPPLER ULTRASOUND OF THE TESTICLES  TECHNIQUE: Complete ultrasound examination of the testicles, epididymis, and other scrotal structures was performed. Color and spectral Doppler ultrasound were also utilized to evaluate blood flow to the testicles.  COMPARISON:  Scrotal ultrasound 03/04/2011  FINDINGS: Right testicle  Measurements: 4.1 x 2.6 x 2.9 cm. No mass or microlithiasis visualized.  Left testicle  Measurements: 3.8 x 2.3 x 2.8 cm . No mass or microlithiasis visualized.  Right epididymis:  Normal in size and appearance.  Left epididymis:  Normal in size and appearance.  Hydrocele:  None visualized.  Varicocele:  None visualized.  Pulsed Doppler interrogation of both testes demonstrates normal low resistance arterial and venous waveforms bilaterally.  IMPRESSION: Normal study.   Electronically Signed   By: Charlett NoseKevin  Dover M.D.   On: 01/07/2014 17:19   [place] Art/ven Flow Abd Pelv Doppler  01/07/2014   CLINICAL DATA:  Left scrotal pain  EXAM: SCROTAL ULTRASOUND  DOPPLER ULTRASOUND OF THE TESTICLES  TECHNIQUE: Complete ultrasound examination of the testicles, epididymis, and other scrotal structures was performed. Color and spectral Doppler ultrasound were also utilized to evaluate blood flow to the testicles.  COMPARISON:  Scrotal ultrasound 03/04/2011  FINDINGS: Right testicle  Measurements: 4.1 x 2.6 x 2.9 cm. No mass or microlithiasis visualized.  Left testicle  Measurements: 3.8 x 2.3 x 2.8 cm . No mass or microlithiasis visualized.  Right epididymis:  Normal in size and appearance.  Left epididymis:  Normal in size and appearance.  Hydrocele:  None  visualized.  Varicocele:  None visualized.  Pulsed Doppler interrogation of both testes demonstrates normal low resistance arterial and venous waveforms bilaterally.  IMPRESSION: Normal study.   Electronically Signed   By: Charlett NoseKevin  Dover M.D.   On: 01/07/2014 17:19    EKG Interpretation None     Declines pain medication  6 PM resting comfortably Results for orders placed during the hospital encounter of 01/07/14  URINALYSIS, ROUTINE W REFLEX MICROSCOPIC      Result Value Ref Range   Color, Urine YELLOW  YELLOW   APPearance CLEAR  CLEAR   Specific Gravity, Urine 1.034 (*) 1.005 - 1.030   pH 6.0  5.0 - 8.0   Glucose, UA NEGATIVE  NEGATIVE mg/dL   Hgb urine dipstick NEGATIVE  NEGATIVE   Bilirubin Urine NEGATIVE  NEGATIVE   Ketones, ur NEGATIVE  NEGATIVE mg/dL   Protein, ur NEGATIVE  NEGATIVE mg/dL   Urobilinogen, UA 1.0  0.0 - 1.0 mg/dL   Nitrite NEGATIVE  NEGATIVE   Leukocytes, UA NEGATIVE  NEGATIVE   [place] Scrotum  01/07/2014   CLINICAL DATA:  Left scrotal pain  EXAM: SCROTAL ULTRASOUND  DOPPLER ULTRASOUND OF THE TESTICLES  TECHNIQUE: Complete ultrasound examination of the testicles, epididymis, and other scrotal structures  was performed. Color and spectral Doppler ultrasound were also utilized to evaluate blood flow to the testicles.  COMPARISON:  Scrotal ultrasound 03/04/2011  FINDINGS: Right testicle  Measurements: 4.1 x 2.6 x 2.9 cm. No mass or microlithiasis visualized.  Left testicle  Measurements: 3.8 x 2.3 x 2.8 cm . No mass or microlithiasis visualized.  Right epididymis:  Normal in size and appearance.  Left epididymis:  Normal in size and appearance.  Hydrocele:  None visualized.  Varicocele:  None visualized.  Pulsed Doppler interrogation of both testes demonstrates normal low resistance arterial and venous waveforms bilaterally.  IMPRESSION: Normal study.   Electronically Signed   By: Charlett Nose M.D.   On: 01/07/2014 17:19   [place] Art/ven Flow Abd Pelv Doppler  01/07/2014    CLINICAL DATA:  Left scrotal pain  EXAM: SCROTAL ULTRASOUND  DOPPLER ULTRASOUND OF THE TESTICLES  TECHNIQUE: Complete ultrasound examination of the testicles, epididymis, and other scrotal structures was performed. Color and spectral Doppler ultrasound were also utilized to evaluate blood flow to the testicles.  COMPARISON:  Scrotal ultrasound 03/04/2011  FINDINGS: Right testicle  Measurements: 4.1 x 2.6 x 2.9 cm. No mass or microlithiasis visualized.  Left testicle  Measurements: 3.8 x 2.3 x 2.8 cm . No mass or microlithiasis visualized.  Right epididymis:  Normal in size and appearance.  Left epididymis:  Normal in size and appearance.  Hydrocele:  None visualized.  Varicocele:  None visualized.  Pulsed Doppler interrogation of both testes demonstrates normal low resistance arterial and venous waveforms bilaterally.  IMPRESSION: Normal study.   Electronically Signed   By: Charlett Nose M.D.   On: 01/07/2014 17:19    MDM   Final diagnoses:  None   no signs of infection. Plan Tylenol or Advil for pain referral resource guide Diagnosis scrotal pain    I personally performed the services described in this documentation, which was scribed in my presence. The recorded information has been reviewed and is accurate.     Doug Sou, MD 01/07/14 [phone]

## 2014-12-29 ENCOUNTER — Emergency Department (HOSPITAL_BASED_OUTPATIENT_CLINIC_OR_DEPARTMENT_OTHER)
Admission: EM | Admit: 2014-12-29 | Discharge: 2014-12-29 | Disposition: A | Discharge status: Home / Self Care | Payer: Self-pay | Attending: Emergency Medicine | Admitting: Emergency Medicine

## 2014-12-29 ENCOUNTER — Emergency Department (HOSPITAL_BASED_OUTPATIENT_CLINIC_OR_DEPARTMENT_OTHER): Payer: Self-pay

## 2014-12-29 ENCOUNTER — Encounter (HOSPITAL_BASED_OUTPATIENT_CLINIC_OR_DEPARTMENT_OTHER): Payer: Self-pay | Admitting: *Deleted

## 2014-12-29 DIAGNOSIS — J45909 Unspecified asthma, uncomplicated: Secondary | ICD-10-CM | POA: Insufficient documentation

## 2014-12-29 DIAGNOSIS — R1084 Generalized abdominal pain: Secondary | ICD-10-CM | POA: Insufficient documentation

## 2014-12-29 DIAGNOSIS — R1013 Epigastric pain: Secondary | ICD-10-CM | POA: Insufficient documentation

## 2014-12-29 DIAGNOSIS — R1011 Right upper quadrant pain: Secondary | ICD-10-CM | POA: Insufficient documentation

## 2014-12-29 DIAGNOSIS — Z720 Tobacco use: Secondary | ICD-10-CM | POA: Insufficient documentation

## 2014-12-29 DIAGNOSIS — R1012 Left upper quadrant pain: Secondary | ICD-10-CM | POA: Insufficient documentation

## 2014-12-29 DIAGNOSIS — Z79899 Other long term (current) drug therapy: Secondary | ICD-10-CM | POA: Insufficient documentation

## 2014-12-29 LAB — COMPREHENSIVE METABOLIC PANEL
ALT: 36 U/L (ref 17–63)
ANION GAP: 8 (ref 5–15)
AST: 52 U/L — ABNORMAL HIGH (ref 15–41)
Albumin: 4 g/dL (ref 3.5–5.0)
Alkaline Phosphatase: 75 U/L (ref 38–126)
BILIRUBIN TOTAL: 0.4 mg/dL (ref 0.3–1.2)
BUN: 19 mg/dL (ref 6–20)
CHLORIDE: 105 mmol/L (ref 101–111)
CO2: 28 mmol/L (ref 22–32)
Calcium: 9.1 mg/dL (ref 8.9–10.3)
Creatinine, Ser: 1.53 mg/dL — ABNORMAL HIGH (ref 0.61–1.24)
GFR calc Af Amer: >60 mL/min (ref >60)
GLUCOSE: 101 mg/dL — AB (ref 65–99)
Potassium: 3.2 mmol/L — ABNORMAL LOW (ref 3.5–5.1)
Sodium: 141 mmol/L (ref 135–145)
Total Protein: 7 g/dL (ref 6.5–8.1)

## 2014-12-29 LAB — CBC WITH DIFFERENTIAL/PLATELET
BASOS ABS: 0 10*3/uL (ref 0.0–0.1)
BASOS PCT: 1 % (ref 0–1)
EOS ABS: 0.2 10*3/uL (ref 0.0–0.7)
EOS PCT: 3 % (ref 0–5)
HCT: 39.2 % (ref 39.0–52.0)
Hemoglobin: 13.1 g/dL (ref 13.0–17.0)
Lymphocytes Relative: 34 % (ref 12–46)
Lymphs Abs: 2.7 10*3/uL (ref 0.7–4.0)
MCH: 28.7 pg (ref 26.0–34.0)
MCHC: 33.4 g/dL (ref 30.0–36.0)
MCV: 86 fL (ref 78.0–100.0)
MONO ABS: 0.6 10*3/uL (ref 0.1–1.0)
Monocytes Relative: 7 % (ref 3–12)
NEUTROS ABS: 4.4 10*3/uL (ref 1.7–7.7)
Neutrophils Relative %: 55 % (ref 43–77)
Platelets: 217 10*3/uL (ref 150–400)
RBC: 4.56 MIL/uL (ref 4.22–5.81)
RDW: 13.2 % (ref 11.5–15.5)
WBC: 7.9 10*3/uL (ref 4.0–10.5)

## 2014-12-29 LAB — LIPASE, BLOOD: LIPASE: 20 U/L — AB (ref 22–51)

## 2014-12-29 NOTE — ED Notes (Signed)
Pt ambulatory to xray, steady gait.  

## 2014-12-29 NOTE — ED Notes (Signed)
C/o R lower back pain, onset 2d ago, also reports a little dizziness, fatigue and constipation, last BM 2d ago, (denies: injury, nvd, fever, bleeding, urinary sx or other sx). No meds PTA. "pain worse when lying on stomach and with certain movements".

## 2014-12-29 NOTE — ED Provider Notes (Signed)
CSN: [id number]     Arrival date & time 12/29/14  0401 History   First MD Initiated Contact with Patient 12/29/14 0422     Chief Complaint  Patient presents with  . Back Pain     (Consider location/radiation/quality/duration/timing/severity/associated sxs/prior Treatment) HPI Comments: Patient is a 29 year old male with no significant past medical history. He presents for evaluation of back and abdominal pain that has been worsening over the past week. He denies any injury or trauma. He reports his abdomen feeling distended and believes he may constipated. He reports feeling full with only a few bites of food. He denies any fevers or chills. He reports pain in his low back without radiation to his legs. He denies any bladder complaints.  Patient is a 29 y.o. male presenting with abdominal pain. The history is provided by the patient.  Abdominal Pain Pain location:  Periumbilical and epigastric Pain quality: cramping   Pain radiates to:  Does not radiate Pain severity:  Moderate Timing:  Constant Progression:  Worsening Chronicity:  New Relieved by:  Nothing Worsened by:  Movement, palpation and position changes Ineffective treatments:  None tried   Past Medical History  Diagnosis Date  . Asthma    Past Surgical History  Procedure Laterality Date  . 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 Hernia repair    . Finger surgery     History reviewed. No pertinent family history. History  Substance Use Topics  . Smoking status: Current Some Day Smoker -- 0.50 packs/day    Types: Cigarettes  . Smokeless tobacco: Not on file  . Alcohol Use: No    Review of Systems  Gastrointestinal: Positive for abdominal pain.  All other systems reviewed and are negative.     Allergies  Review of patient's allergies indicates no known allergies.  Home Medications   Prior to Admission medications   Medication Sig Start Date End Date Taking? Authorizing Provider   albuterol (PROVENTIL HFA;VENTOLIN HFA) 108 (90 BASE) MCG/ACT inhaler Inhale 1-2 puffs into the lungs every 6 (six) hours as needed for wheezing or shortness of breath. 04/07/13   Shari Upstill, PA-C   BP 133/65 mmHg  Pulse 64  Temp(Src) 99.1 F (37.3 C) (Oral)  Resp 18  Ht  (1.905 m)  Wt 231 lb (104.781 kg)  BMI 28.87 kg/m2  SpO2 98% Physical Exam  Constitutional: He is oriented to person, place, and time. He appears well-developed and well-nourished. No distress.  HENT:  Head: Normocephalic and atraumatic.  Neck: Normal range of motion. Neck supple.  Cardiovascular: Normal rate, regular rhythm and normal heart sounds.   No murmur heard. Pulmonary/Chest: Effort normal and breath sounds normal. No respiratory distress. He has no wheezes.  Abdominal: Soft. Bowel sounds are normal. He exhibits no distension. There is tenderness. There is no rebound and no guarding.  There is tenderness to palpation in the left upper quadrant, right upper quadrant, and epigastric region.  Musculoskeletal: Normal range of motion. He exhibits no edema.  Neurological: He is alert and oriented to person, place, and time.  Skin: Skin is warm and dry. He is not diaphoretic.  Nursing note and vitals reviewed.   ED Course  Procedures (including critical care time) Labs Review Labs Reviewed  COMPREHENSIVE METABOLIC PANEL  LIPASE, BLOOD  CBC WITH DIFFERENTIAL/PLATELET    Imaging Review No results found.   EKG Interpretation None      MDM   Final diagnoses:  Abdominal  pain, generalized    Workup reveals no elevation of white count, normal liver functions and lipase, and x-rays consistent with constipation. I highly doubt an emergent condition and feel as though he is appropriate for discharge. I will recommend magnesium citrate and have him return as needed for any problems.    Geoffery Lyons, MD 12/29/14 [phone]

## 2014-12-29 NOTE — Discharge Instructions (Signed)
Magnesium citrate: Drink entire 10 ounce bottle mixed with equal parts Sprite or Gatorade for relief of constipation.  Return to the emergency department if you develop high fever, bloody stools, worsening pain, or other new and concerning symptoms.   Abdominal Pain Many things can cause abdominal pain. Usually, abdominal pain is not caused by a disease and will improve without treatment. It can often be observed and treated at home. Your health care provider will do a physical exam and possibly order blood tests and X-rays to help determine the seriousness of your pain. However, in many cases, more time must pass before a clear cause of the pain can be found. Before that point, your health care provider may not know if you need more testing or further treatment. HOME CARE INSTRUCTIONS  Monitor your abdominal pain for any changes. The following actions may help to alleviate any discomfort you are experiencing:  Only take over-the-counter or prescription medicines as directed by your health care provider.  Do not take laxatives unless directed to do so by your health care provider.  Try a clear liquid diet (broth, tea, or water) as directed by your health care provider. Slowly move to a bland diet as tolerated. SEEK MEDICAL CARE IF:  You have unexplained abdominal pain.  You have abdominal pain associated with nausea or diarrhea.  You have pain when you urinate or have a bowel movement.  You experience abdominal pain that wakes you in the night.  You have abdominal pain that is worsened or improved by eating food.  You have abdominal pain that is worsened with eating fatty foods.  You have a fever. SEEK IMMEDIATE MEDICAL CARE IF:   Your pain does not go away within 2 hours.  You keep throwing up (vomiting).  Your pain is felt only in portions of the abdomen, such as the right side or the left lower portion of the abdomen.  You pass bloody or black tarry stools. MAKE SURE  YOU:  Understand these instructions.   Will watch your condition.   Will get help right away if you are not doing well or get worse.  Document Released: 02/17/2005 Document Revised: 05/15/2013 Document Reviewed: 01/17/2013 [facility] Patient Information 2015 [place], [place]. This information is not intended to replace advice given to you by your health care provider. Make sure you discuss any questions you have with your health care provider.

## 2015-01-24 ENCOUNTER — Emergency Department (HOSPITAL_BASED_OUTPATIENT_CLINIC_OR_DEPARTMENT_OTHER)
Admission: EM | Admit: 2015-01-24 | Discharge: 2015-01-25 | Disposition: A | Discharge status: Home / Self Care | Payer: Self-pay | Attending: Emergency Medicine | Admitting: Emergency Medicine

## 2015-01-24 ENCOUNTER — Encounter (HOSPITAL_BASED_OUTPATIENT_CLINIC_OR_DEPARTMENT_OTHER): Payer: Self-pay | Admitting: *Deleted

## 2015-01-24 DIAGNOSIS — X58XXXA Exposure to other specified factors, initial encounter: Secondary | ICD-10-CM | POA: Insufficient documentation

## 2015-01-24 DIAGNOSIS — Y9289 Other specified places as the place of occurrence of the external cause: Secondary | ICD-10-CM | POA: Insufficient documentation

## 2015-01-24 DIAGNOSIS — J45909 Unspecified asthma, uncomplicated: Secondary | ICD-10-CM | POA: Insufficient documentation

## 2015-01-24 DIAGNOSIS — Y998 Other external cause status: Secondary | ICD-10-CM | POA: Insufficient documentation

## 2015-01-24 DIAGNOSIS — Z720 Tobacco use: Secondary | ICD-10-CM | POA: Insufficient documentation

## 2015-01-24 DIAGNOSIS — M79672 Pain in left foot: Secondary | ICD-10-CM

## 2015-01-24 DIAGNOSIS — Y9389 Activity, other specified: Secondary | ICD-10-CM | POA: Insufficient documentation

## 2015-01-24 DIAGNOSIS — Z79899 Other long term (current) drug therapy: Secondary | ICD-10-CM | POA: Insufficient documentation

## 2015-01-24 DIAGNOSIS — S8391XA Sprain of unspecified site of right knee, initial encounter: Secondary | ICD-10-CM | POA: Insufficient documentation

## 2015-01-24 NOTE — ED Notes (Signed)
Pt reports right knee painx 2 weeks denies know injury

## 2015-01-24 NOTE — ED Provider Notes (Signed)
CSN: [id number]     Arrival date & time 01/24/15  2339 History  This chart was scribe for Pricilla Loveless, MD by Angelene Giovanni, ED Scribe. The patient was seen in room MH11/MH11 and the patient's care was started at 11:53 PM.    Chief Complaint  Patient presents with  . Knee Pain   The history is provided by the patient.   HPI Comments: Dale Gallagher is a 29 y.o. male who presents to the Emergency Department complaining of right knee pain onset 2 weeks ago and bottom of left heel pain onset 6 months ago. He reports associated right knee swelling but denies any swelling to the bottom of his left heel. He denies any injury to his right knee pain. He states that 6 months ago he was playing basketball and when he went to shoot the basket, he hit the bottom of his heel hard on the ground and he denies any other injury to the area. He states that he has tried Ibuprofen about 2 days ago with no relief. 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   Past Medical History  Diagnosis Date  . Asthma    Past Surgical History  Procedure Laterality Date  . 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 Hernia repair    . Finger surgery     History reviewed. No pertinent family history. Social History  Substance Use Topics  . Smoking status: Current Some Day Smoker -- 0.50 packs/day    Types: Cigarettes  . Smokeless tobacco: None  . Alcohol Use: No    Review of Systems  Constitutional: Negative for fever.  Musculoskeletal: Positive for joint swelling and arthralgias.  All other systems reviewed and are negative.     Allergies  Review of patient's allergies indicates no known allergies.  Home Medications   Prior to Admission medications   Medication Sig Start Date End Date Taking? Authorizing Provider  ibuprofen (ADVIL,MOTRIN) 400 MG tablet Take 400 mg by mouth every 6 (six) hours as needed.   Yes Historical Provider,  MD  albuterol (PROVENTIL HFA;VENTOLIN HFA) 108 (90 BASE) MCG/ACT inhaler Inhale 1-2 puffs into the lungs every 6 (six) hours as needed for wheezing or shortness of breath. 04/07/13   Shari Upstill, PA-C   BP 141/73 mmHg  Pulse 64  Temp(Src) 98 F (36.7 C) (Oral)  Resp 18  Ht 6\' 3"  (1.905 m)  Wt 230 lb (104.327 kg)  BMI 28.75 kg/m2  SpO2 99% Physical Exam  Constitutional: He is oriented to person, place, and time. He appears well-developed and well-nourished.  HENT:  Head: Normocephalic and atraumatic.  Right Ear: External ear normal.  Left Ear: External ear normal.  Nose: Nose normal.  Eyes: Right eye exhibits no discharge. Left eye exhibits no discharge.  Neck: Neck supple.  Cardiovascular: Normal rate and intact distal pulses.   Pulses:      Dorsalis pedis pulses are 2+ on the right side, and 2+ on the left side.  Pulmonary/Chest: Effort normal.  Abdominal: He exhibits no distension.  Musculoskeletal: He exhibits no edema.  Right knee: Mild infrapatellar swelling. Minimal tenderness, no joint effusion, no ligament laxity. No erythema or warmth Left heel: Point Tenderness over Left calcaneus, no swelling or erythema.    Neurological: He is alert and oriented to person, place, and time.  Normal strength and sensation in bilateral lower extremities  Skin: Skin is warm and dry. No erythema.  Nursing note and vitals reviewed.   ED Course  Procedures (including critical care time) DIAGNOSTIC STUDIES: Oxygen Saturation is 99% on RA, normal by my interpretation.    COORDINATION OF CARE: 11:57 PM- Pt advised of plan for treatment and pt agrees.    Labs Review Labs Reviewed - No data to display  Imaging Review Dg Knee Complete 4 Views Right  01/25/2015   CLINICAL DATA:  Right knee pain of 2 weeks duration.  No trauma.  EXAM: RIGHT KNEE - COMPLETE 4+ VIEW  COMPARISON:  None.  FINDINGS: Negative fracture, dislocation or radiopaque foreign body. No arthritic changes are evident.  There is no bone lesion or bony destruction.  IMPRESSION: Negative.   Electronically Signed   By: Ellery Plunk M.D.   On: 01/25/2015 00:55   Dg Foot Complete Left  01/25/2015   CLINICAL DATA:  Hit bottom of heel while playing basketball 6 months ago, persistent pain.  EXAM: LEFT FOOT - COMPLETE 3+ VIEW  COMPARISON:  None.  FINDINGS: There is no evidence of fracture or dislocation. Type 3 navicular bone. There is no evidence of arthropathy or other focal bone abnormality. Soft tissues are unremarkable.  IMPRESSION: Negative.   Electronically Signed   By: Awilda Metro M.D.   On: 01/25/2015 00:55   I have personally reviewed and evaluated these images and lab results as part of my medical decision-making.   EKG Interpretation None      MDM   Final diagnoses:  Right knee sprain, initial encounter  Heel pain, left    Patient is neurovascularly intact and has no evidence of acute bony injury or pathology. No significant knee swelling and no joint effusion that would signify acute infection. Full range of motion of knee appreciated. At this point will refer to sports medicine and recommend NSAIDs and ice as needed.  I personally performed the services described in this documentation, which was scribed in my presence. The recorded information has been reviewed and is accurate.   Pricilla Loveless, MD 01/25/15 [phone]

## 2015-01-24 NOTE — ED Notes (Signed)
Right knee pain x 2 weeks no know injury reports swelling nightly swelling relieved with ibuprofen, minimally  helpful with pain. Also with left heel pain after landing wrong while playing basketball x 6 months.

## 2015-01-25 ENCOUNTER — Emergency Department (HOSPITAL_BASED_OUTPATIENT_CLINIC_OR_DEPARTMENT_OTHER): Payer: Self-pay

## 2015-01-25 MED ORDER — IBUPROFEN 200 MG PO TABS
600.0000 mg | ORAL_TABLET | Freq: Once | ORAL | Status: AC
  Administered 2015-01-25: 600 mg via ORAL
  Filled 2015-01-25 (×2): qty 1

## 2015-08-12 ENCOUNTER — Emergency Department (HOSPITAL_BASED_OUTPATIENT_CLINIC_OR_DEPARTMENT_OTHER)
Admission: EM | Admit: 2015-08-12 | Discharge: 2015-08-13 | Disposition: A | Discharge status: Home / Self Care | Payer: Self-pay | Attending: Emergency Medicine | Admitting: Emergency Medicine

## 2015-08-12 ENCOUNTER — Encounter (HOSPITAL_BASED_OUTPATIENT_CLINIC_OR_DEPARTMENT_OTHER): Payer: Self-pay | Admitting: Emergency Medicine

## 2015-08-12 DIAGNOSIS — R51 Headache: Secondary | ICD-10-CM | POA: Insufficient documentation

## 2015-08-12 DIAGNOSIS — F17210 Nicotine dependence, cigarettes, uncomplicated: Secondary | ICD-10-CM | POA: Insufficient documentation

## 2015-08-12 DIAGNOSIS — R1110 Vomiting, unspecified: Secondary | ICD-10-CM | POA: Insufficient documentation

## 2015-08-12 DIAGNOSIS — R197 Diarrhea, unspecified: Secondary | ICD-10-CM | POA: Insufficient documentation

## 2015-08-12 DIAGNOSIS — J45909 Unspecified asthma, uncomplicated: Secondary | ICD-10-CM | POA: Insufficient documentation

## 2015-08-12 NOTE — ED Notes (Signed)
Pt reports constant headache x 3 days, pt vomited twice yesterday, +diarrhea

## 2015-09-15 ENCOUNTER — Encounter (HOSPITAL_BASED_OUTPATIENT_CLINIC_OR_DEPARTMENT_OTHER): Payer: Self-pay | Admitting: *Deleted

## 2015-09-15 DIAGNOSIS — F17210 Nicotine dependence, cigarettes, uncomplicated: Secondary | ICD-10-CM | POA: Insufficient documentation

## 2015-09-15 DIAGNOSIS — J029 Acute pharyngitis, unspecified: Secondary | ICD-10-CM | POA: Insufficient documentation

## 2015-09-15 DIAGNOSIS — J45909 Unspecified asthma, uncomplicated: Secondary | ICD-10-CM | POA: Insufficient documentation

## 2015-09-15 LAB — RAPID STREP SCREEN (MED CTR MEBANE ONLY): STREPTOCOCCUS, GROUP A SCREEN (DIRECT): NEGATIVE

## 2015-09-15 NOTE — ED Notes (Signed)
Sore throat since this am. States his son has strep throat.

## 2015-09-16 ENCOUNTER — Emergency Department (HOSPITAL_BASED_OUTPATIENT_CLINIC_OR_DEPARTMENT_OTHER): Payer: Medicaid Other

## 2015-09-16 ENCOUNTER — Emergency Department (HOSPITAL_BASED_OUTPATIENT_CLINIC_OR_DEPARTMENT_OTHER)
Admission: EM | Admit: 2015-09-16 | Discharge: 2015-09-16 | Disposition: A | Discharge status: Home / Self Care | Payer: Medicaid Other | Attending: Emergency Medicine | Admitting: Emergency Medicine

## 2015-09-16 DIAGNOSIS — J029 Acute pharyngitis, unspecified: Secondary | ICD-10-CM

## 2015-09-16 MED ORDER — CEPHALEXIN 500 MG PO CAPS: 500.0000 mg | ORAL_CAPSULE | Freq: Four times a day (QID) | ORAL | Status: AC

## 2015-09-16 NOTE — ED Notes (Signed)
C/o scratchy throat, chills cough and congestion x 1 day

## 2015-09-16 NOTE — ED Provider Notes (Signed)
CSN: [id number]     Arrival date & time 09/15/15  2253 History   First MD Initiated Contact with Patient 09/16/15 [phone]     Chief Complaint  Patient presents with  . Sore Throat     (Consider location/radiation/quality/duration/timing/severity/associated sxs/prior Treatment) HPI Comments: Patient is a 30 year old male who presents with complaints of sore throat. His son was diagnosed with strep and is being treated with antibiotics.  Patient is a 30 y.o. male presenting with pharyngitis. The history is provided by the patient.  Sore Throat This is a new problem. The current episode started 2 days ago. The problem occurs constantly. The problem has been rapidly worsening. Pertinent negatives include no chest pain and no shortness of breath. The symptoms are aggravated by swallowing, eating and drinking. Nothing relieves the symptoms. He has tried acetaminophen for the symptoms. The treatment provided no relief.    Past Medical History  Diagnosis Date  . Asthma    Past Surgical History  Procedure Laterality Date  . 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 Hernia repair    . Finger surgery     No family history on file. Social History  Substance Use Topics  . Smoking status: Current Some Day Smoker -- 0.50 packs/day    Types: Cigarettes  . Smokeless tobacco: None  . Alcohol Use: No    Review of Systems  Respiratory: Negative for shortness of breath.   Cardiovascular: Negative for chest pain.  All other systems reviewed and are negative.     Allergies  Review of patient's allergies indicates no known allergies.  Home Medications   Prior to Admission medications   Medication Sig Start Date End Date Taking? Authorizing Provider  albuterol (PROVENTIL HFA;VENTOLIN HFA) 108 (90 BASE) MCG/ACT inhaler Inhale 1-2 puffs into the lungs every 6 (six) hours as needed for wheezing or shortness of breath. 04/07/13   Elpidio Anis, PA-C  cephALEXin (KEFLEX) 500 MG  capsule Take 1 capsule (500 mg total) by mouth 4 (four) times daily. 09/16/15   Geoffery Lyons, MD  ibuprofen (ADVIL,MOTRIN) 400 MG tablet Take 400 mg by mouth every 6 (six) hours as needed.    Historical Provider, MD   BP 125/77 mmHg  Pulse 65  Temp(Src) 98.5 F (36.9 C) (Oral)  Resp 20  Ht  (1.88 m)  Wt 229 lb (103.874 kg)  BMI 29.39 kg/m2  SpO2 100% Physical Exam  Constitutional: He is oriented to person, place, and time. He appears well-developed and well-nourished. No distress.  HENT:  Head: Normocephalic and atraumatic.  There is erythema of the posterior oropharynx, however no exudates.  Neck: Normal range of motion. Neck supple.  Cardiovascular: Normal rate, regular rhythm and normal heart sounds.   No murmur heard. Pulmonary/Chest: Effort normal and breath sounds normal. No respiratory distress. He has no wheezes. He has no rales.  Abdominal: Soft. Bowel sounds are normal. He exhibits no distension.  Musculoskeletal: Normal range of motion. He exhibits no edema.  Lymphadenopathy:    He has no cervical adenopathy.  Neurological: He is alert and oriented to person, place, and time.  Skin: Skin is warm and dry. He is not diaphoretic.  Nursing note and vitals reviewed.   ED Course  Procedures (including critical care time) Labs Review Labs Reviewed  RAPID STREP SCREEN (NOT AT [facility])  CULTURE, GROUP A STREP [facility])    Imaging Review No results found. I have personally reviewed and evaluated these images and  lab results as part of my medical decision-making.   EKG Interpretation None      MDM   Final diagnoses:  Pharyngitis    Strep test negative, however will treat as if it is strep since his son had a positive strep test this week.    Geoffery Lyonsouglas Kapri Nero, MD 09/16/15 [phone]

## 2015-09-16 NOTE — Discharge Instructions (Signed)
Keflex as prescribed.  Ibuprofen 600 mg every 6 hours as needed for pain.  Follow-up with your primary Dr. if not improving in the next 3-4 days.   Pharyngitis Pharyngitis is redness, pain, and swelling (inflammation) of your pharynx.  CAUSES  Pharyngitis is usually caused by infection. Most of the time, these infections are from viruses (viral) and are part of a cold. However, sometimes pharyngitis is caused by bacteria (bacterial). Pharyngitis can also be caused by allergies. Viral pharyngitis may be spread from person to person by coughing, sneezing, and personal items or utensils (cups, forks, spoons, toothbrushes). Bacterial pharyngitis may be spread from person to person by more intimate contact, such as kissing.  SIGNS AND SYMPTOMS  Symptoms of pharyngitis include:   Sore throat.   Tiredness (fatigue).   Low-grade fever.   Headache.  Joint pain and muscle aches.  Skin rashes.  Swollen lymph nodes.  Plaque-like film on throat or tonsils (often seen with bacterial pharyngitis). DIAGNOSIS  Your health care provider will ask you questions about your illness and your symptoms. Your medical history, along with a physical exam, is often all that is needed to diagnose pharyngitis. Sometimes, a rapid strep test is done. Other lab tests may also be done, depending on the suspected cause.  TREATMENT  Viral pharyngitis will usually get better in 3-4 days without the use of medicine. Bacterial pharyngitis is treated with medicines that kill germs (antibiotics).  HOME CARE INSTRUCTIONS   Drink enough water and fluids to keep your urine clear or pale yellow.   Only take over-the-counter or prescription medicines as directed by your health care provider:   If you are prescribed antibiotics, make sure you finish them even if you start to feel better.   Do not take aspirin.   Get lots of rest.   Gargle with 8 oz of salt water ( tsp of salt per 1 qt of water) as often as  every 1-2 hours to soothe your throat.   Throat lozenges (if you are not at risk for choking) or sprays may be used to soothe your throat. SEEK MEDICAL CARE IF:   You have large, tender lumps in your neck.  You have a rash.  You cough up green, yellow-brown, or bloody spit. SEEK IMMEDIATE MEDICAL CARE IF:   Your neck becomes stiff.  You drool or are unable to swallow liquids.  You vomit or are unable to keep medicines or liquids down.  You have severe pain that does not go away with the use of recommended medicines.  You have trouble breathing (not caused by a stuffy nose). MAKE SURE YOU:   Understand these instructions.  Will watch your condition.  Will get help right away if you are not doing well or get worse.   This information is not intended to replace advice given to you by your health care provider. Make sure you discuss any questions you have with your health care provider.   Document Released: 05/10/2005 Document Revised: 02/28/2013 Document Reviewed: 01/15/2013 Elsevier Interactive Patient Education 2016 [employer]  Strep Throat Strep throat is a bacterial infection of the throat. Your health care provider may call the infection tonsillitis or pharyngitis, depending on whether there is swelling in the tonsils or at the back of the throat. Strep throat is most common during the cold months of the year in children who are 325-30 years of age, but it can happen during any season in people of any age. This infection is spread  from person to person (contagious) through coughing, sneezing, or close contact. CAUSES Strep throat is caused by the bacteria called Streptococcus pyogenes. RISK FACTORS This condition is more likely to develop in:  People who spend time in crowded places where the infection can spread easily.  People who have close contact with someone who has strep throat. SYMPTOMS Symptoms of this condition include:  Fever or chills.   Redness,  swelling, or pain in the tonsils or throat.  Pain or difficulty when swallowing.  White or yellow spots on the tonsils or throat.  Swollen, tender glands in the neck or under the jaw.  Red rash all over the body (rare). DIAGNOSIS This condition is diagnosed by performing a rapid strep test or by taking a swab of your throat (throat culture test). Results from a rapid strep test are usually ready in a few minutes, but throat culture test results are available after one or two days. TREATMENT This condition is treated with antibiotic medicine. HOME CARE INSTRUCTIONS Medicines  Take over-the-counter and prescription medicines only as told by your health care provider.  Take your antibiotic as told by your health care provider. Do not stop taking the antibiotic even if you start to feel better.  Have family members who also have a sore throat or fever tested for strep throat. They may need antibiotics if they have the strep infection. Eating and Drinking  Do not share food, drinking cups, or personal items that could cause the infection to spread to other people.  If swallowing is difficult, try eating soft foods until your sore throat feels better.  Drink enough fluid to keep your urine clear or pale yellow. General Instructions  Gargle with a salt-water mixture 3-4 times per day or as needed. To make a salt-water mixture, completely dissolve -1 tsp of salt in 1 cup of warm water.  Make sure that all household members wash their hands well.  Get plenty of rest.  Stay home from school or work until you have been taking antibiotics for 24 hours.  Keep all follow-up visits as told by your health care provider. This is important. SEEK MEDICAL CARE IF:  The glands in your neck continue to get bigger.  You develop a rash, cough, or earache.  You cough up a thick liquid that is green, yellow-brown, or bloody.  You have pain or discomfort that does not get better with  medicine.  Your problems seem to be getting worse rather than better.  You have a fever. SEEK IMMEDIATE MEDICAL CARE IF:  You have new symptoms, such as vomiting, severe headache, stiff or painful neck, chest pain, or shortness of breath.  You have severe throat pain, drooling, or changes in your voice.  You have swelling of the neck, or the skin on the neck becomes red and tender.  You have signs of dehydration, such as fatigue, dry mouth, and decreased urination.  You become increasingly sleepy, or you cannot wake up completely.  Your joints become red or painful.   This information is not intended to replace advice given to you by your health care provider. Make sure you discuss any questions you have with your health care provider.   Document Released: 05/07/2000 Document Revised: 01/29/2015 Document Reviewed: 09/02/2014 Elsevier Interactive Patient Education [employer]

## 2015-09-19 LAB — CULTURE, GROUP A STREP (THRC)

## 2016-12-14 IMAGING — DX DG FOOT COMPLETE 3+V*L*
3 series · 3 of 3 positions shown · non-contrast
Comparison: None.

CLINICAL DATA: Hit bottom of heel while playing basketball 6 months
ago, persistent pain.

EXAM:
LEFT FOOT - COMPLETE 3+ VIEW

[foot ap]
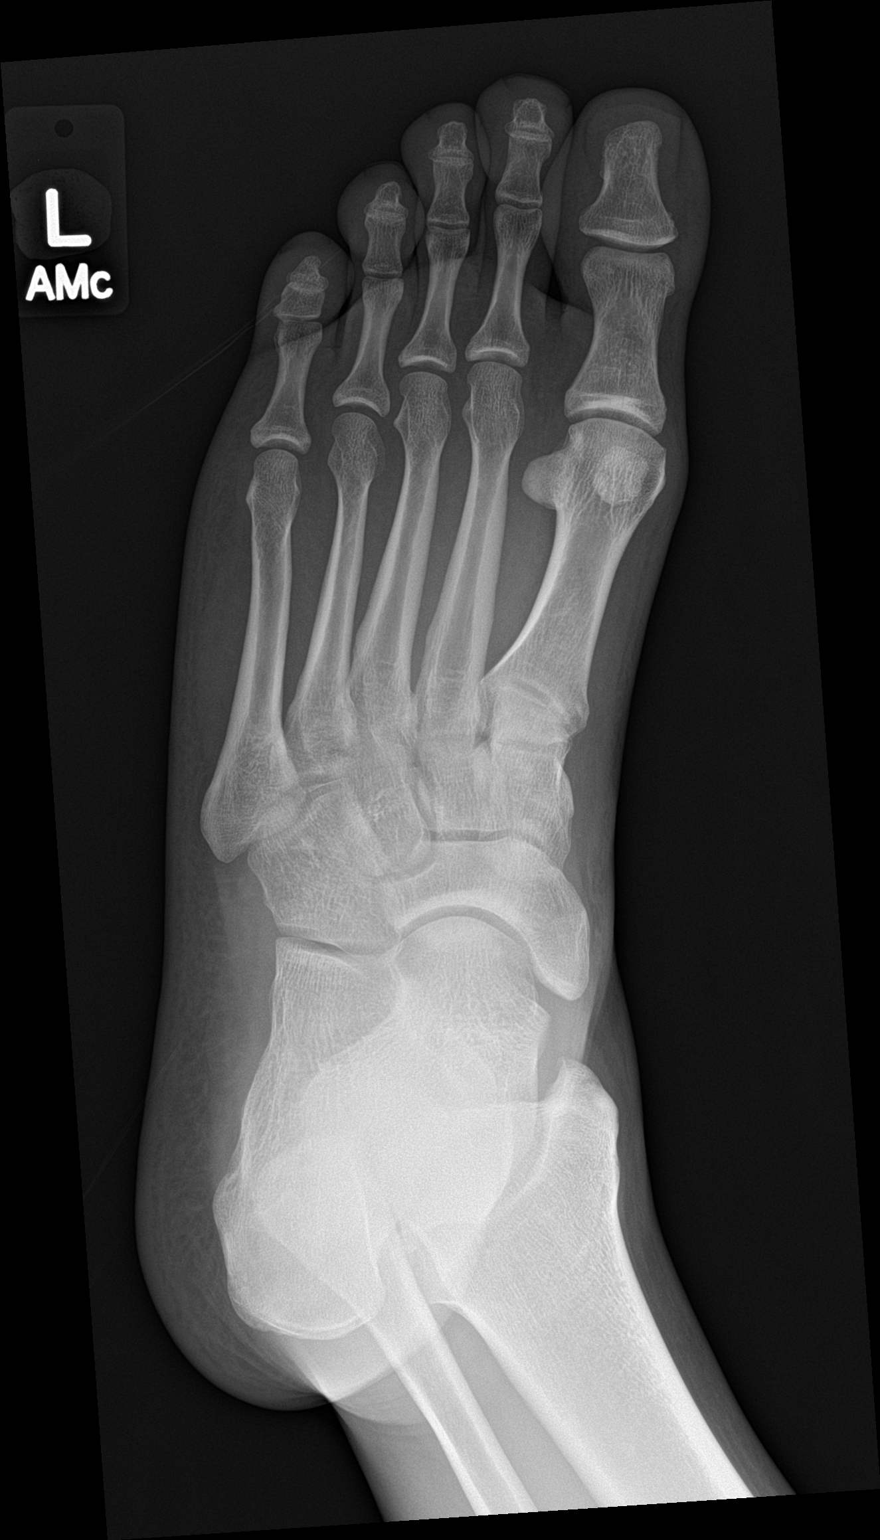

[foot obl]
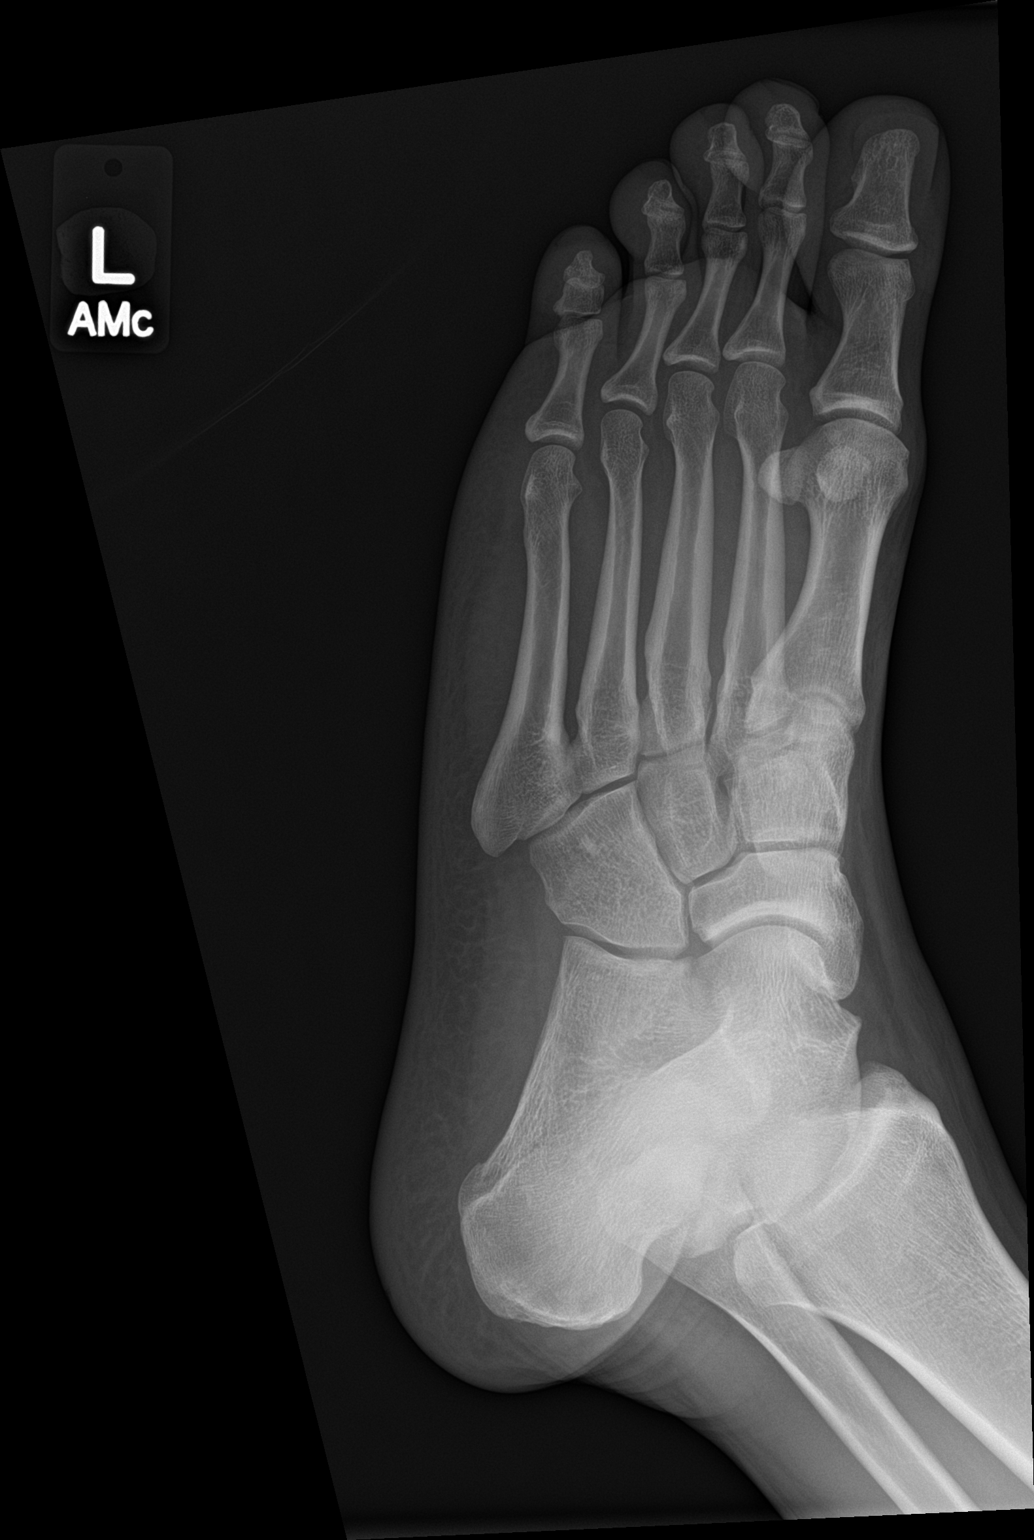

[foot lat]
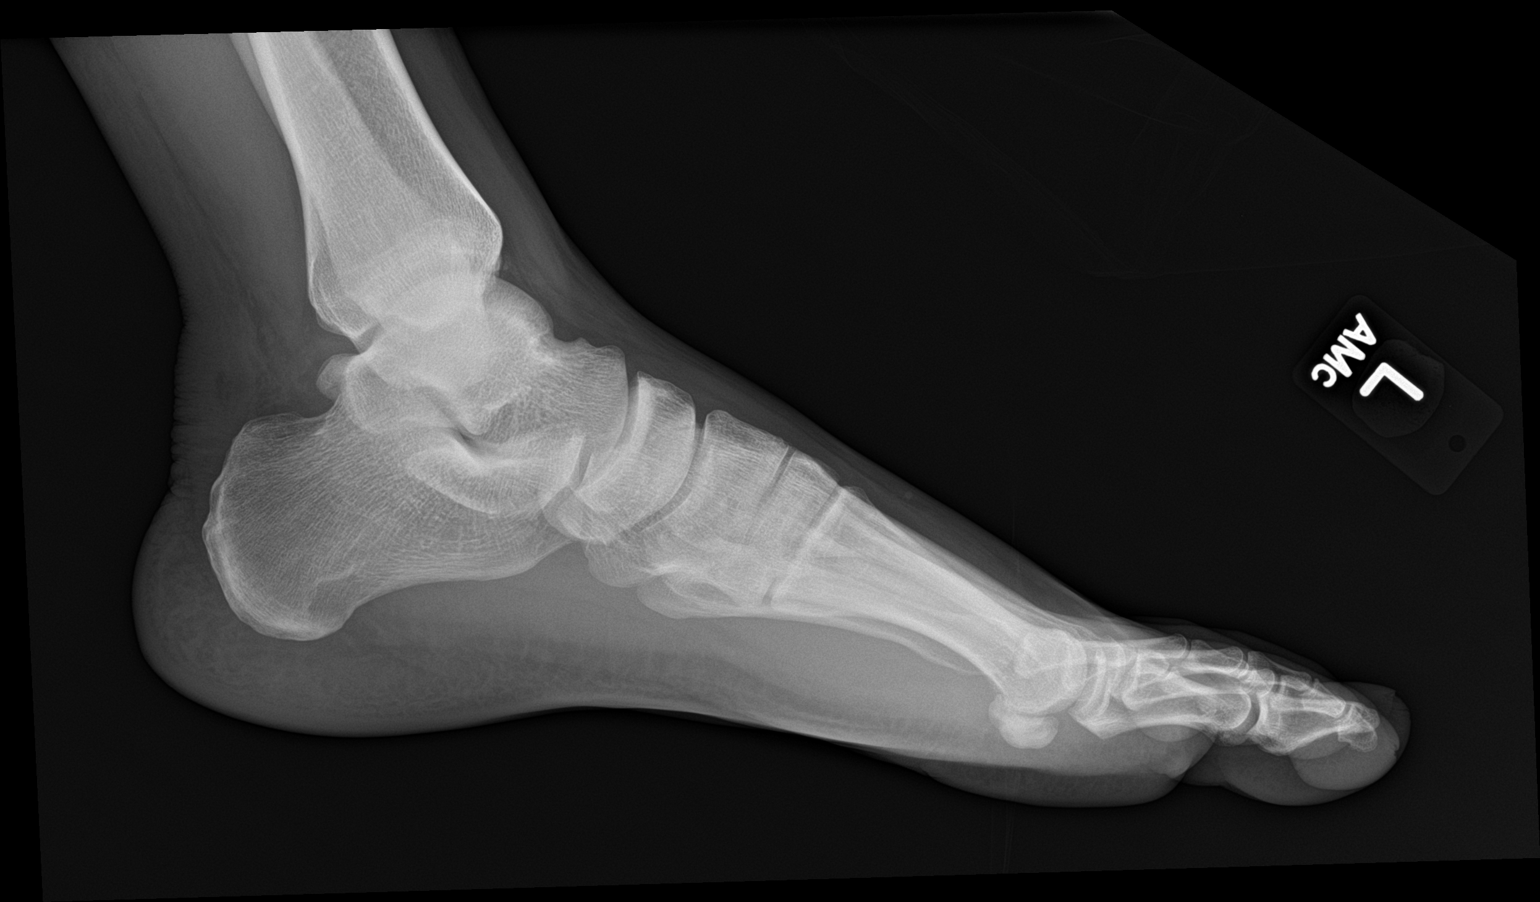

[3 of 3 positions shown; findings below may reference images not displayed]

FINDINGS: There is no evidence of fracture or dislocation. Type 3 navicular
bone. There is no evidence of arthropathy or other focal bone
abnormality. Soft tissues are unremarkable.
IMPRESSION: Negative.
